# Patient Record
Sex: Female | Born: 1955 | Race: Black or African American | Hispanic: No | Marital: Single | Smoking: Never smoker
Health system: Southern US, Community
[De-identification: ages and names within clinical notes are randomized; demographics above are authoritative.]

## PROBLEM LIST (undated history)

## (undated) DIAGNOSIS — E119 Type 2 diabetes mellitus without complications: Secondary | ICD-10-CM

## (undated) DIAGNOSIS — K219 Gastro-esophageal reflux disease without esophagitis: Secondary | ICD-10-CM

## (undated) HISTORY — DX: Gastro-esophageal reflux disease without esophagitis: K21.9

## (undated) HISTORY — PX: OTHER SURGICAL HISTORY: SHX169

## (undated) HISTORY — PX: BREAST EXCISIONAL BIOPSY: SUR124

## (undated) HISTORY — DX: Type 2 diabetes mellitus without complications: E11.9

---

## 1998-04-10 ENCOUNTER — Ambulatory Visit (HOSPITAL_COMMUNITY): Admission: RE | Admit: 1998-04-10 | Discharge: 1998-04-10 | Payer: Self-pay | Admitting: Obstetrics and Gynecology

## 1998-04-14 ENCOUNTER — Other Ambulatory Visit: Admission: RE | Admit: 1998-04-14 | Discharge: 1998-04-14 | Payer: Self-pay | Admitting: Obstetrics and Gynecology

## 1999-04-19 ENCOUNTER — Other Ambulatory Visit: Admission: RE | Admit: 1999-04-19 | Discharge: 1999-04-19 | Payer: Self-pay | Admitting: Obstetrics and Gynecology

## 1999-10-11 ENCOUNTER — Other Ambulatory Visit: Admission: RE | Admit: 1999-10-11 | Discharge: 1999-10-11 | Payer: Self-pay | Admitting: Obstetrics and Gynecology

## 2000-04-28 ENCOUNTER — Encounter: Payer: Self-pay | Admitting: Obstetrics and Gynecology

## 2000-04-28 ENCOUNTER — Ambulatory Visit (HOSPITAL_COMMUNITY): Admission: RE | Admit: 2000-04-28 | Discharge: 2000-04-28 | Payer: Self-pay | Admitting: Obstetrics and Gynecology

## 2000-06-13 ENCOUNTER — Other Ambulatory Visit: Admission: RE | Admit: 2000-06-13 | Discharge: 2000-06-13 | Payer: Self-pay | Admitting: *Deleted

## 2001-04-16 ENCOUNTER — Other Ambulatory Visit: Admission: RE | Admit: 2001-04-16 | Discharge: 2001-04-16 | Payer: Self-pay | Admitting: Obstetrics and Gynecology

## 2002-05-09 ENCOUNTER — Other Ambulatory Visit: Admission: RE | Admit: 2002-05-09 | Discharge: 2002-05-09 | Payer: Self-pay | Admitting: Obstetrics and Gynecology

## 2002-08-16 ENCOUNTER — Ambulatory Visit (HOSPITAL_COMMUNITY): Admission: RE | Admit: 2002-08-16 | Discharge: 2002-08-16 | Payer: Self-pay | Admitting: Obstetrics and Gynecology

## 2002-08-16 ENCOUNTER — Encounter: Payer: Self-pay | Admitting: Obstetrics and Gynecology

## 2003-08-18 ENCOUNTER — Encounter: Payer: Self-pay | Admitting: Obstetrics and Gynecology

## 2003-08-18 ENCOUNTER — Ambulatory Visit (HOSPITAL_COMMUNITY): Admission: RE | Admit: 2003-08-18 | Discharge: 2003-08-18 | Payer: Self-pay | Admitting: Obstetrics and Gynecology

## 2004-08-19 ENCOUNTER — Ambulatory Visit (HOSPITAL_COMMUNITY): Admission: RE | Admit: 2004-08-19 | Discharge: 2004-08-19 | Payer: Self-pay | Admitting: Obstetrics and Gynecology

## 2005-06-30 ENCOUNTER — Ambulatory Visit: Payer: Self-pay | Admitting: Internal Medicine

## 2005-08-05 ENCOUNTER — Ambulatory Visit: Payer: Self-pay | Admitting: Internal Medicine

## 2005-08-12 ENCOUNTER — Ambulatory Visit: Payer: Self-pay | Admitting: Internal Medicine

## 2005-08-22 ENCOUNTER — Ambulatory Visit (HOSPITAL_COMMUNITY): Admission: RE | Admit: 2005-08-22 | Discharge: 2005-08-22 | Payer: Self-pay | Admitting: Obstetrics and Gynecology

## 2006-12-08 ENCOUNTER — Ambulatory Visit (HOSPITAL_COMMUNITY): Admission: RE | Admit: 2006-12-08 | Discharge: 2006-12-08 | Payer: Self-pay | Admitting: Family Medicine

## 2008-01-25 ENCOUNTER — Ambulatory Visit (HOSPITAL_COMMUNITY): Admission: RE | Admit: 2008-01-25 | Discharge: 2008-01-25 | Payer: Self-pay | Admitting: Family Medicine

## 2009-01-28 ENCOUNTER — Ambulatory Visit (HOSPITAL_COMMUNITY): Admission: RE | Admit: 2009-01-28 | Discharge: 2009-01-28 | Payer: Self-pay | Admitting: Internal Medicine

## 2009-03-04 ENCOUNTER — Encounter: Admission: RE | Admit: 2009-03-04 | Discharge: 2009-03-04 | Payer: Self-pay | Admitting: Internal Medicine

## 2010-03-04 ENCOUNTER — Ambulatory Visit (HOSPITAL_COMMUNITY): Admission: RE | Admit: 2010-03-04 | Discharge: 2010-03-04 | Payer: Self-pay | Admitting: Internal Medicine

## 2010-06-28 ENCOUNTER — Encounter: Payer: Self-pay | Admitting: Internal Medicine

## 2011-01-06 NOTE — Letter (Signed)
Summary: Colonoscopy Letter  [facility]  [address] [place], [place] [address]   Phone: [phone]  Fax: [phone]      June 28, 2010 MRN: [id number]   [facility] [address] [place], [place]  [address]   Dear Ms. Benoist,   According to your medical record, it is time for you to schedule a Colonoscopy. The American Cancer Society recommends this procedure as a method to detect early colon cancer. Patients with a family history of colon cancer, or a personal history of colon polyps or inflammatory bowel disease are at increased risk.  This letter has been generated based on the recommendations made at the time of your procedure. If you feel that in your particular situation this may no longer apply, please contact our office.  Please call our office at [phone] to schedule this appointment or to update your records at your earliest convenience.  Thank you for cooperating with [place] to provide you with the very best care possible.   Sincerely,  Wilhemina Bonito. Marina Goodell, M.D.  [facility] [phone]

## 2011-03-15 ENCOUNTER — Other Ambulatory Visit: Payer: Self-pay | Admitting: Internal Medicine

## 2011-03-15 DIAGNOSIS — Z1231 Encounter for screening mammogram for malignant neoplasm of breast: Secondary | ICD-10-CM

## 2011-04-01 ENCOUNTER — Ambulatory Visit (HOSPITAL_COMMUNITY)
Admission: RE | Admit: 2011-04-01 | Discharge: 2011-04-01 | Disposition: A | Discharge status: Home / Self Care | Payer: Self-pay | Source: Ambulatory Visit | Attending: Internal Medicine | Admitting: Internal Medicine

## 2011-04-01 DIAGNOSIS — Z1231 Encounter for screening mammogram for malignant neoplasm of breast: Secondary | ICD-10-CM

## 2012-03-14 ENCOUNTER — Other Ambulatory Visit: Payer: Self-pay | Admitting: Internal Medicine

## 2012-03-14 DIAGNOSIS — Z1231 Encounter for screening mammogram for malignant neoplasm of breast: Secondary | ICD-10-CM

## 2012-04-09 ENCOUNTER — Ambulatory Visit (HOSPITAL_COMMUNITY): Payer: Self-pay

## 2012-04-10 ENCOUNTER — Ambulatory Visit (INDEPENDENT_AMBULATORY_CARE_PROVIDER_SITE_OTHER): Payer: Self-pay | Admitting: *Deleted

## 2012-04-10 ENCOUNTER — Ambulatory Visit (HOSPITAL_COMMUNITY)
Admission: RE | Admit: 2012-04-10 | Discharge: 2012-04-10 | Disposition: A | Discharge status: Home / Self Care | Payer: Self-pay | Source: Ambulatory Visit | Attending: Internal Medicine | Admitting: Internal Medicine

## 2012-04-10 VITALS — BP 128/71 | HR 90 | Temp 97.6°F | Ht 62.0 in | Wt 141.1 lb

## 2012-04-10 DIAGNOSIS — Z1231 Encounter for screening mammogram for malignant neoplasm of breast: Secondary | ICD-10-CM

## 2012-04-10 DIAGNOSIS — N898 Other specified noninflammatory disorders of vagina: Secondary | ICD-10-CM

## 2012-04-10 DIAGNOSIS — Z01419 Encounter for gynecological examination (general) (routine) without abnormal findings: Secondary | ICD-10-CM

## 2012-04-10 NOTE — Progress Notes (Signed)
Addended by: Lynnell Dike on: 04/10/2012 09:29 AM   Modules accepted: Orders

## 2012-04-10 NOTE — Patient Instructions (Signed)
Taught patient how to perform BSE. Let her know [facility] will cover Pap smears every 3 years unless has a history of abnormal Pap smears. Patient is escorted to mammography for screening mammogram. Let patient know will follow up with her within the next couple weeks with results. Patient verbalized understanding.

## 2012-04-10 NOTE — Progress Notes (Signed)
No complaints today.  Pap Smear:  Completed Pap smear today. Per patient last Pap smear was around April 2010 and normal. Per patient no history of abnormal Pap smears. No Pap smear results in EPIC.  Physical exam: Breasts Breasts symmetrical. Multiple keloids on bilateral breasts. No nipple retraction bilateral breasts. No nipple discharge bilateral breasts. No lymphadenopathy. No lumps palpated bilateral breasts. No complaints of pain or tenderness on exam.          Pelvic/Bimanual   Ext Genitalia No lesions, no swelling and no discharge observed on external genitalia.         Vagina Vagina pink and normal texture. No lesions or thick yellowish colored discharge observed in vagina. Wet prep completed.          Cervix Cervix is present. Cervix pink and of normal texture. Cervix friable. Thick yellowish colored discharge observed on cervix.          Uterus Uterus is present and palpable. Uterus in normal position and normal size.       Adnexae Bilateral ovaries present and palpable. No tenderness on palpation.        Rectovaginal No rectal exam completed today since patient had no rectal complaints. No skin abnormalities observed on exam.

## 2012-04-11 LAB — WET PREP, GENITAL: Trich, Wet Prep: NONE SEEN

## 2012-04-17 ENCOUNTER — Other Ambulatory Visit: Payer: Self-pay | Admitting: *Deleted

## 2012-04-17 ENCOUNTER — Telehealth: Payer: Self-pay | Admitting: *Deleted

## 2012-04-17 DIAGNOSIS — B379 Candidiasis, unspecified: Secondary | ICD-10-CM

## 2012-04-17 MED ORDER — FLUCONAZOLE 150 MG PO TABS: 150.0000 mg | ORAL_TABLET | Freq: Once | ORAL | Status: AC

## 2012-04-17 NOTE — Telephone Encounter (Signed)
Telephoned patient and left message to return call to [employer]. Patient has rx for Diflucan at pharmacy.

## 2012-04-18 ENCOUNTER — Telehealth: Payer: Self-pay | Admitting: *Deleted

## 2012-04-18 NOTE — Telephone Encounter (Signed)
Patient returned phone call. Gave her results to Pap smear letting her know it was normal and showed yeast. Let patient know a prescription for Diflucan has been sent to her pharmacy for her to pick up. Let patient know that her mammogram was normal and that she will need a screening mammogram in 1 year. Patient verbalized understanding.

## 2012-05-10 ENCOUNTER — Encounter: Payer: Self-pay | Admitting: Family Medicine

## 2012-05-10 ENCOUNTER — Ambulatory Visit (INDEPENDENT_AMBULATORY_CARE_PROVIDER_SITE_OTHER): Payer: Self-pay | Admitting: Family Medicine

## 2012-05-10 VITALS — BP 130/74 | HR 85 | Temp 98.1°F | Ht 62.0 in | Wt 143.0 lb

## 2012-05-10 DIAGNOSIS — Z01419 Encounter for gynecological examination (general) (routine) without abnormal findings: Secondary | ICD-10-CM | POA: Insufficient documentation

## 2012-05-10 DIAGNOSIS — R12 Heartburn: Secondary | ICD-10-CM | POA: Insufficient documentation

## 2012-05-10 DIAGNOSIS — Z0000 Encounter for general adult medical examination without abnormal findings: Secondary | ICD-10-CM

## 2012-05-10 NOTE — Progress Notes (Signed)
  Subjective:    Patient ID: Lasandra Beech, female    DOB: December 01, 1956, 56 y.o.   MRN: [id number]  HPI No concerns today Here to est care Has lost about 20 lbs intentionally over the last 6 months Watching diet Goes to gym 2-3x/week.  Treadmill and weights Review of Systems No change in BM, Denies CP, SOB, HA, N/V/D, fever     Objective:   Physical Exam Vital signs reviewed General appearance - alert, well appearing, and in no distress Heart - normal rate, regular rhythm, normal S1, S2, no murmurs, rubs, clicks or gallops Chest - clear to auscultation, no wheezes, rales or rhonchi, symmetric air entry, no tachypnea, retractions or cyanosis Abdomen - soft, nontender, nondistended, no masses or organomegaly Extremities - peripheral pulses normal, no pedal edema, no clubbing or cyanosis        Assessment & Plan:

## 2012-05-10 NOTE — Assessment & Plan Note (Signed)
Here to est care.  Had gyn exam at [facility] GYN office.  Will return for lipid, CMET, and tetanus shot.

## 2012-05-10 NOTE — Patient Instructions (Signed)
Please go over to [facility] Once you have that card, call for a lab appt for your cholesterol check Make sure you do not eat or drink except water 8 hours before Please come back for a nurse visit for your tetanus shot

## 2013-04-25 ENCOUNTER — Other Ambulatory Visit (HOSPITAL_COMMUNITY): Payer: Self-pay | Admitting: Internal Medicine

## 2013-04-25 DIAGNOSIS — Z1231 Encounter for screening mammogram for malignant neoplasm of breast: Secondary | ICD-10-CM

## 2013-04-26 ENCOUNTER — Ambulatory Visit (HOSPITAL_COMMUNITY)
Admission: RE | Admit: 2013-04-26 | Discharge: 2013-04-26 | Disposition: A | Discharge status: Home / Self Care | Payer: BC Managed Care – PPO | Source: Ambulatory Visit | Attending: Internal Medicine | Admitting: Internal Medicine

## 2013-04-26 DIAGNOSIS — Z1231 Encounter for screening mammogram for malignant neoplasm of breast: Secondary | ICD-10-CM

## 2013-11-19 ENCOUNTER — Encounter: Payer: Self-pay | Admitting: Internal Medicine

## 2014-01-09 ENCOUNTER — Ambulatory Visit (AMBULATORY_SURGERY_CENTER): Payer: Self-pay | Admitting: *Deleted

## 2014-01-09 VITALS — Ht 62.0 in | Wt 137.0 lb

## 2014-01-09 DIAGNOSIS — Z86010 Personal history of colonic polyps: Secondary | ICD-10-CM

## 2014-01-09 MED ORDER — MOVIPREP 100 G PO SOLR: ORAL | Status: DC

## 2014-01-09 NOTE — Progress Notes (Signed)
No allergies to eggs or soy. No problems with anesthesia.  

## 2014-01-10 ENCOUNTER — Encounter: Payer: Self-pay | Admitting: Internal Medicine

## 2014-01-15 ENCOUNTER — Encounter: Payer: Self-pay | Admitting: Internal Medicine

## 2014-01-23 ENCOUNTER — Encounter: Payer: Self-pay | Admitting: Internal Medicine

## 2014-01-23 ENCOUNTER — Ambulatory Visit (AMBULATORY_SURGERY_CENTER): Payer: 59 | Admitting: Internal Medicine

## 2014-01-23 VITALS — BP 149/87 | HR 63 | Temp 98.9°F | Resp 11 | Ht 62.0 in | Wt 137.0 lb

## 2014-01-23 DIAGNOSIS — Z86010 Personal history of colonic polyps: Secondary | ICD-10-CM

## 2014-01-23 DIAGNOSIS — Z1211 Encounter for screening for malignant neoplasm of colon: Secondary | ICD-10-CM

## 2014-01-23 MED ORDER — SODIUM CHLORIDE 0.9 % IV SOLN: 500.0000 mL | INTRAVENOUS | Status: DC

## 2014-01-23 NOTE — Patient Instructions (Signed)

## 2014-01-23 NOTE — Progress Notes (Signed)
NO EGG OR SOY ALLERGY EWM NO PROBLEMS WITH PAST SEDATION. EWM 

## 2014-01-23 NOTE — Progress Notes (Signed)
Report to pacu RN, vss, bbs=clear 

## 2014-01-23 NOTE — Op Note (Signed)
[facility] [address] [place], [id number]   COLONOSCOPY PROCEDURE REPORT  PATIENT: Amanda Valencia, Amanda J.  MR#: [id number] BIRTHDATE: [birth year] , 57  yrs. old GENDER: Female ENDOSCOPIST: Roxy CedarJohn N Broghan Pannone Jr, MD REFERRED XB:JYNWGNBY:Ronald Polite, M.D. PROCEDURE DATE:  01/23/2014 PROCEDURE:   Colonoscopy, screening First Screening Colonoscopy - Avg.  risk and is 50 yrs.  old or older - No.  Prior Negative Screening - Now for repeat screening. N/A  History of Adenoma - Now for follow-up colonoscopy & has been > or = to 3 yrs.  N/A  Polyps Removed Today? No.  Recommend repeat exam, <10 yrs? No. ASA CLASS:   Class II INDICATIONS:Patient's personal history of colon polyps.   index exam September 2006 with diminutive polyp (no path) MEDICATIONS: MAC sedation, administered by CRNA and propofol (Diprivan) 250mg  IV  DESCRIPTION OF PROCEDURE:   After the risks benefits and alternatives of the procedure were thoroughly explained, informed consent was obtained.  A digital rectal exam revealed no abnormalities of the rectum.   The LB [id number]-[id number]  endoscope was introduced through the anus and advanced to the cecum, which was identified by both the appendix and ileocecal valve. No adverse events experienced.   The quality of the prep was excellent, using MoviPrep  The instrument was then slowly withdrawn as the colon was fully examined.   COLON FINDINGS: Mild diverticulosis was noted in the sigmoid colon. The colon was otherwise normal.  There was no  inflammation, polyps or cancers .  Retroflexed views revealed no abnormalities. The time to cecum=4 minutes 07 seconds.  Withdrawal time=11 minutes 50 seconds.  The scope was withdrawn and the procedure completed.  COMPLICATIONS: There were no complications.  ENDOSCOPIC IMPRESSION: 1.   Mild diverticulosis was noted in the sigmoid colon 2.   The colon was otherwise normal  RECOMMENDATIONS: 1. Continue current colorectal screening  recommendations for "routine risk" patients with a repeat colonoscopy in 10 years.   eSigned:  Roxy CedarJohn N Onyekachi Gathright Jr, MD 01/23/2014 11:34 AM   cc: Renford DillsPolite, Ronald MD and The Patient

## 2014-01-24 ENCOUNTER — Telehealth: Payer: Self-pay | Admitting: *Deleted

## 2014-01-24 NOTE — Telephone Encounter (Signed)
  Follow up Call-  Call back number 01/23/2014  Post procedure Call Back phone  # [phone]  Permission to leave phone message Yes     Patient questions:  Do you have a fever, pain , or abdominal swelling? no Pain Score  0 *  Have you tolerated food without any problems? yes  Have you been able to return to your normal activities? yes  Do you have any questions about your discharge instructions: Diet   no Medications  no Follow up visit  no  Do you have questions or concerns about your Care? no  Actions: * If pain score is 4 or above: No action needed, pain <4.

## 2014-05-26 ENCOUNTER — Other Ambulatory Visit (HOSPITAL_COMMUNITY): Payer: Self-pay | Admitting: Internal Medicine

## 2014-05-26 DIAGNOSIS — Z1231 Encounter for screening mammogram for malignant neoplasm of breast: Secondary | ICD-10-CM

## 2014-05-29 ENCOUNTER — Ambulatory Visit (HOSPITAL_COMMUNITY)
Admission: RE | Admit: 2014-05-29 | Discharge: 2014-05-29 | Disposition: A | Discharge status: Home / Self Care | Payer: 59 | Source: Ambulatory Visit | Attending: Internal Medicine | Admitting: Internal Medicine

## 2014-05-29 DIAGNOSIS — Z1231 Encounter for screening mammogram for malignant neoplasm of breast: Secondary | ICD-10-CM | POA: Insufficient documentation

## 2014-09-05 ENCOUNTER — Encounter: Payer: Self-pay | Admitting: Internal Medicine

## 2014-10-06 ENCOUNTER — Encounter: Payer: Self-pay | Admitting: Internal Medicine

## 2015-04-29 ENCOUNTER — Other Ambulatory Visit (HOSPITAL_COMMUNITY): Payer: Self-pay | Admitting: Internal Medicine

## 2015-04-29 DIAGNOSIS — Z1231 Encounter for screening mammogram for malignant neoplasm of breast: Secondary | ICD-10-CM

## 2015-06-02 ENCOUNTER — Ambulatory Visit (HOSPITAL_COMMUNITY)
Admission: RE | Admit: 2015-06-02 | Discharge: 2015-06-02 | Disposition: A | Discharge status: Home / Self Care | Payer: 59 | Source: Ambulatory Visit | Attending: Internal Medicine | Admitting: Internal Medicine

## 2015-06-02 DIAGNOSIS — Z1231 Encounter for screening mammogram for malignant neoplasm of breast: Secondary | ICD-10-CM | POA: Diagnosis present

## 2016-04-28 ENCOUNTER — Other Ambulatory Visit: Payer: Self-pay | Admitting: Nurse Practitioner

## 2016-04-28 ENCOUNTER — Other Ambulatory Visit: Payer: Self-pay

## 2016-04-28 DIAGNOSIS — M25511 Pain in right shoulder: Secondary | ICD-10-CM

## 2016-04-28 DIAGNOSIS — Z1231 Encounter for screening mammogram for malignant neoplasm of breast: Secondary | ICD-10-CM

## 2016-05-01 ENCOUNTER — Ambulatory Visit
Admission: RE | Admit: 2016-05-01 | Discharge: 2016-05-01 | Disposition: A | Discharge status: Home / Self Care | Payer: BLUE CROSS/BLUE SHIELD | Source: Ambulatory Visit | Attending: Nurse Practitioner | Admitting: Nurse Practitioner

## 2016-05-01 DIAGNOSIS — M25511 Pain in right shoulder: Secondary | ICD-10-CM

## 2016-06-02 ENCOUNTER — Ambulatory Visit
Admission: RE | Admit: 2016-06-02 | Discharge: 2016-06-02 | Disposition: A | Discharge status: Home / Self Care | Payer: BLUE CROSS/BLUE SHIELD | Source: Ambulatory Visit

## 2016-06-02 DIAGNOSIS — Z1231 Encounter for screening mammogram for malignant neoplasm of breast: Secondary | ICD-10-CM

## 2016-06-03 ENCOUNTER — Ambulatory Visit: Payer: Self-pay

## 2017-05-02 ENCOUNTER — Other Ambulatory Visit: Payer: Self-pay | Admitting: Internal Medicine

## 2017-05-02 DIAGNOSIS — Z1231 Encounter for screening mammogram for malignant neoplasm of breast: Secondary | ICD-10-CM

## 2017-06-05 ENCOUNTER — Ambulatory Visit
Admission: RE | Admit: 2017-06-05 | Discharge: 2017-06-05 | Disposition: A | Discharge status: Home / Self Care | Payer: BLUE CROSS/BLUE SHIELD | Source: Ambulatory Visit | Attending: Internal Medicine | Admitting: Internal Medicine

## 2017-06-05 DIAGNOSIS — Z1231 Encounter for screening mammogram for malignant neoplasm of breast: Secondary | ICD-10-CM

## 2017-10-30 ENCOUNTER — Encounter (HOSPITAL_COMMUNITY): Payer: Self-pay

## 2018-05-14 ENCOUNTER — Other Ambulatory Visit: Payer: Self-pay | Admitting: Internal Medicine

## 2018-05-14 ENCOUNTER — Other Ambulatory Visit (HOSPITAL_COMMUNITY)
Admission: RE | Admit: 2018-05-14 | Discharge: 2018-05-14 | Disposition: A | Discharge status: Home / Self Care | Payer: BLUE CROSS/BLUE SHIELD | Source: Ambulatory Visit | Attending: Internal Medicine | Admitting: Internal Medicine

## 2018-05-14 DIAGNOSIS — Z124 Encounter for screening for malignant neoplasm of cervix: Secondary | ICD-10-CM | POA: Diagnosis present

## 2018-05-17 LAB — CYTOLOGY - PAP
Diagnosis: NEGATIVE
HPV (WINDOPATH): NOT DETECTED

## 2018-05-31 ENCOUNTER — Other Ambulatory Visit: Payer: Self-pay | Admitting: Internal Medicine

## 2018-05-31 DIAGNOSIS — Z1231 Encounter for screening mammogram for malignant neoplasm of breast: Secondary | ICD-10-CM

## 2018-06-21 ENCOUNTER — Ambulatory Visit
Admission: RE | Admit: 2018-06-21 | Discharge: 2018-06-21 | Disposition: A | Discharge status: Home / Self Care | Payer: BLUE CROSS/BLUE SHIELD | Source: Ambulatory Visit | Attending: Internal Medicine | Admitting: Internal Medicine

## 2018-06-21 DIAGNOSIS — Z1231 Encounter for screening mammogram for malignant neoplasm of breast: Secondary | ICD-10-CM

## 2018-06-22 ENCOUNTER — Ambulatory Visit: Payer: BLUE CROSS/BLUE SHIELD

## 2018-11-08 ENCOUNTER — Ambulatory Visit
Admission: RE | Admit: 2018-11-08 | Discharge: 2018-11-08 | Disposition: A | Discharge status: Home / Self Care | Payer: BLUE CROSS/BLUE SHIELD | Source: Ambulatory Visit | Attending: Internal Medicine | Admitting: Internal Medicine

## 2018-11-08 ENCOUNTER — Other Ambulatory Visit: Payer: Self-pay | Admitting: Internal Medicine

## 2018-11-08 DIAGNOSIS — R140 Abdominal distension (gaseous): Secondary | ICD-10-CM

## 2019-05-16 ENCOUNTER — Other Ambulatory Visit: Payer: Self-pay | Admitting: Internal Medicine

## 2019-05-16 DIAGNOSIS — Z1231 Encounter for screening mammogram for malignant neoplasm of breast: Secondary | ICD-10-CM

## 2019-06-28 ENCOUNTER — Ambulatory Visit: Payer: BLUE CROSS/BLUE SHIELD

## 2020-06-11 ENCOUNTER — Other Ambulatory Visit: Payer: Self-pay | Admitting: Internal Medicine

## 2020-06-11 DIAGNOSIS — Z1231 Encounter for screening mammogram for malignant neoplasm of breast: Secondary | ICD-10-CM

## 2020-07-10 ENCOUNTER — Other Ambulatory Visit: Payer: Self-pay

## 2020-07-10 ENCOUNTER — Ambulatory Visit
Admission: RE | Admit: 2020-07-10 | Discharge: 2020-07-10 | Disposition: A | Discharge status: Home / Self Care | Payer: Medicare Other | Source: Ambulatory Visit | Attending: Internal Medicine | Admitting: Internal Medicine

## 2020-07-10 DIAGNOSIS — Z1231 Encounter for screening mammogram for malignant neoplasm of breast: Secondary | ICD-10-CM

## 2021-02-15 DIAGNOSIS — E1169 Type 2 diabetes mellitus with other specified complication: Secondary | ICD-10-CM | POA: Diagnosis not present

## 2021-05-31 ENCOUNTER — Other Ambulatory Visit: Payer: Self-pay | Admitting: Internal Medicine

## 2021-05-31 DIAGNOSIS — Z1231 Encounter for screening mammogram for malignant neoplasm of breast: Secondary | ICD-10-CM

## 2021-06-18 DIAGNOSIS — U071 COVID-19: Secondary | ICD-10-CM | POA: Diagnosis not present

## 2021-06-18 DIAGNOSIS — Z20822 Contact with and (suspected) exposure to covid-19: Secondary | ICD-10-CM | POA: Diagnosis not present

## 2021-07-21 ENCOUNTER — Ambulatory Visit
Admission: RE | Admit: 2021-07-21 | Discharge: 2021-07-21 | Disposition: A | Discharge status: Home / Self Care | Payer: Medicare Other | Source: Ambulatory Visit | Attending: Physician Assistant | Admitting: Physician Assistant

## 2021-07-21 ENCOUNTER — Other Ambulatory Visit: Payer: Self-pay | Admitting: Physician Assistant

## 2021-07-21 DIAGNOSIS — R058 Other specified cough: Secondary | ICD-10-CM | POA: Diagnosis not present

## 2021-07-21 DIAGNOSIS — R059 Cough, unspecified: Secondary | ICD-10-CM | POA: Diagnosis not present

## 2021-07-22 ENCOUNTER — Other Ambulatory Visit: Payer: Self-pay

## 2021-07-22 ENCOUNTER — Ambulatory Visit
Admission: RE | Admit: 2021-07-22 | Discharge: 2021-07-22 | Disposition: A | Discharge status: Home / Self Care | Payer: Medicare Other | Source: Ambulatory Visit | Attending: Internal Medicine | Admitting: Internal Medicine

## 2021-07-22 DIAGNOSIS — Z1231 Encounter for screening mammogram for malignant neoplasm of breast: Secondary | ICD-10-CM | POA: Diagnosis not present

## 2021-07-26 ENCOUNTER — Other Ambulatory Visit: Payer: Self-pay | Admitting: Internal Medicine

## 2021-07-26 DIAGNOSIS — R928 Other abnormal and inconclusive findings on diagnostic imaging of breast: Secondary | ICD-10-CM

## 2021-08-10 ENCOUNTER — Ambulatory Visit
Admission: RE | Admit: 2021-08-10 | Discharge: 2021-08-10 | Disposition: A | Discharge status: Home / Self Care | Payer: Medicare Other | Source: Ambulatory Visit | Attending: Internal Medicine | Admitting: Internal Medicine

## 2021-08-10 ENCOUNTER — Other Ambulatory Visit: Payer: Self-pay | Admitting: Internal Medicine

## 2021-08-10 ENCOUNTER — Other Ambulatory Visit: Payer: Self-pay

## 2021-08-10 DIAGNOSIS — R928 Other abnormal and inconclusive findings on diagnostic imaging of breast: Secondary | ICD-10-CM

## 2021-08-10 DIAGNOSIS — R922 Inconclusive mammogram: Secondary | ICD-10-CM | POA: Diagnosis not present

## 2021-11-10 DIAGNOSIS — Z79899 Other long term (current) drug therapy: Secondary | ICD-10-CM | POA: Diagnosis not present

## 2021-11-10 DIAGNOSIS — E1165 Type 2 diabetes mellitus with hyperglycemia: Secondary | ICD-10-CM | POA: Diagnosis not present

## 2021-11-10 DIAGNOSIS — K760 Fatty (change of) liver, not elsewhere classified: Secondary | ICD-10-CM | POA: Diagnosis not present

## 2021-11-10 DIAGNOSIS — Z0000 Encounter for general adult medical examination without abnormal findings: Secondary | ICD-10-CM | POA: Diagnosis not present

## 2021-11-10 DIAGNOSIS — Z5181 Encounter for therapeutic drug level monitoring: Secondary | ICD-10-CM | POA: Diagnosis not present

## 2021-11-10 DIAGNOSIS — Z7984 Long term (current) use of oral hypoglycemic drugs: Secondary | ICD-10-CM | POA: Diagnosis not present

## 2021-11-10 DIAGNOSIS — Z23 Encounter for immunization: Secondary | ICD-10-CM | POA: Diagnosis not present

## 2022-02-09 ENCOUNTER — Other Ambulatory Visit: Payer: Medicare Other

## 2022-03-03 ENCOUNTER — Other Ambulatory Visit: Payer: Self-pay | Admitting: Internal Medicine

## 2022-03-03 ENCOUNTER — Ambulatory Visit
Admission: RE | Admit: 2022-03-03 | Discharge: 2022-03-03 | Disposition: A | Discharge status: Home / Self Care | Payer: Medicare Other | Source: Ambulatory Visit | Attending: Internal Medicine | Admitting: Internal Medicine

## 2022-03-03 DIAGNOSIS — N6310 Unspecified lump in the right breast, unspecified quadrant: Secondary | ICD-10-CM

## 2022-03-03 DIAGNOSIS — N6313 Unspecified lump in the right breast, lower outer quadrant: Secondary | ICD-10-CM | POA: Diagnosis not present

## 2022-03-03 DIAGNOSIS — R928 Other abnormal and inconclusive findings on diagnostic imaging of breast: Secondary | ICD-10-CM

## 2022-05-18 DIAGNOSIS — K760 Fatty (change of) liver, not elsewhere classified: Secondary | ICD-10-CM | POA: Diagnosis not present

## 2022-05-18 DIAGNOSIS — E7800 Pure hypercholesterolemia, unspecified: Secondary | ICD-10-CM | POA: Diagnosis not present

## 2022-05-18 DIAGNOSIS — E1165 Type 2 diabetes mellitus with hyperglycemia: Secondary | ICD-10-CM | POA: Diagnosis not present

## 2022-09-06 ENCOUNTER — Other Ambulatory Visit: Payer: Self-pay | Admitting: Internal Medicine

## 2022-09-06 ENCOUNTER — Ambulatory Visit
Admission: RE | Admit: 2022-09-06 | Discharge: 2022-09-06 | Disposition: A | Discharge status: Home / Self Care | Payer: Medicare Other | Source: Ambulatory Visit | Attending: Internal Medicine | Admitting: Internal Medicine

## 2022-09-06 DIAGNOSIS — R921 Mammographic calcification found on diagnostic imaging of breast: Secondary | ICD-10-CM | POA: Diagnosis not present

## 2022-09-06 DIAGNOSIS — N6002 Solitary cyst of left breast: Secondary | ICD-10-CM | POA: Diagnosis not present

## 2022-09-06 DIAGNOSIS — N6313 Unspecified lump in the right breast, lower outer quadrant: Secondary | ICD-10-CM | POA: Diagnosis not present

## 2022-09-06 DIAGNOSIS — N6320 Unspecified lump in the left breast, unspecified quadrant: Secondary | ICD-10-CM

## 2022-09-06 DIAGNOSIS — N6310 Unspecified lump in the right breast, unspecified quadrant: Secondary | ICD-10-CM

## 2022-09-15 ENCOUNTER — Ambulatory Visit
Admission: RE | Admit: 2022-09-15 | Discharge: 2022-09-15 | Disposition: A | Discharge status: Home / Self Care | Payer: Medicare Other | Source: Ambulatory Visit | Attending: Internal Medicine | Admitting: Internal Medicine

## 2022-09-15 DIAGNOSIS — N6489 Other specified disorders of breast: Secondary | ICD-10-CM | POA: Diagnosis not present

## 2022-09-15 DIAGNOSIS — R921 Mammographic calcification found on diagnostic imaging of breast: Secondary | ICD-10-CM

## 2022-09-15 HISTORY — PX: BREAST BIOPSY: SHX20

## 2022-10-26 DIAGNOSIS — E119 Type 2 diabetes mellitus without complications: Secondary | ICD-10-CM | POA: Diagnosis not present

## 2022-11-17 DIAGNOSIS — K529 Noninfective gastroenteritis and colitis, unspecified: Secondary | ICD-10-CM | POA: Diagnosis not present

## 2022-11-17 DIAGNOSIS — K760 Fatty (change of) liver, not elsewhere classified: Secondary | ICD-10-CM | POA: Diagnosis not present

## 2022-11-17 DIAGNOSIS — E1169 Type 2 diabetes mellitus with other specified complication: Secondary | ICD-10-CM | POA: Diagnosis not present

## 2022-11-17 DIAGNOSIS — E7800 Pure hypercholesterolemia, unspecified: Secondary | ICD-10-CM | POA: Diagnosis not present

## 2022-11-17 DIAGNOSIS — R109 Unspecified abdominal pain: Secondary | ICD-10-CM | POA: Diagnosis not present

## 2023-01-20 DIAGNOSIS — E119 Type 2 diabetes mellitus without complications: Secondary | ICD-10-CM | POA: Diagnosis not present

## 2023-01-20 DIAGNOSIS — Z03818 Encounter for observation for suspected exposure to other biological agents ruled out: Secondary | ICD-10-CM | POA: Diagnosis not present

## 2023-01-20 DIAGNOSIS — J069 Acute upper respiratory infection, unspecified: Secondary | ICD-10-CM | POA: Diagnosis not present

## 2023-02-02 IMAGING — MG DIGITAL SCREENING BILAT W/ CAD
4 series · 4 of 4 positions shown · non-contrast
Comparison: Previous exams.

CLINICAL DATA: Screening.

EXAM:
DIGITAL SCREENING BILATERAL MAMMOGRAM WITH CAD
TECHNIQUE: Bilateral screening digital craniocaudal and mediolateral oblique
mammograms were obtained. The images were evaluated with
computer-aided detection.

[R MLO]
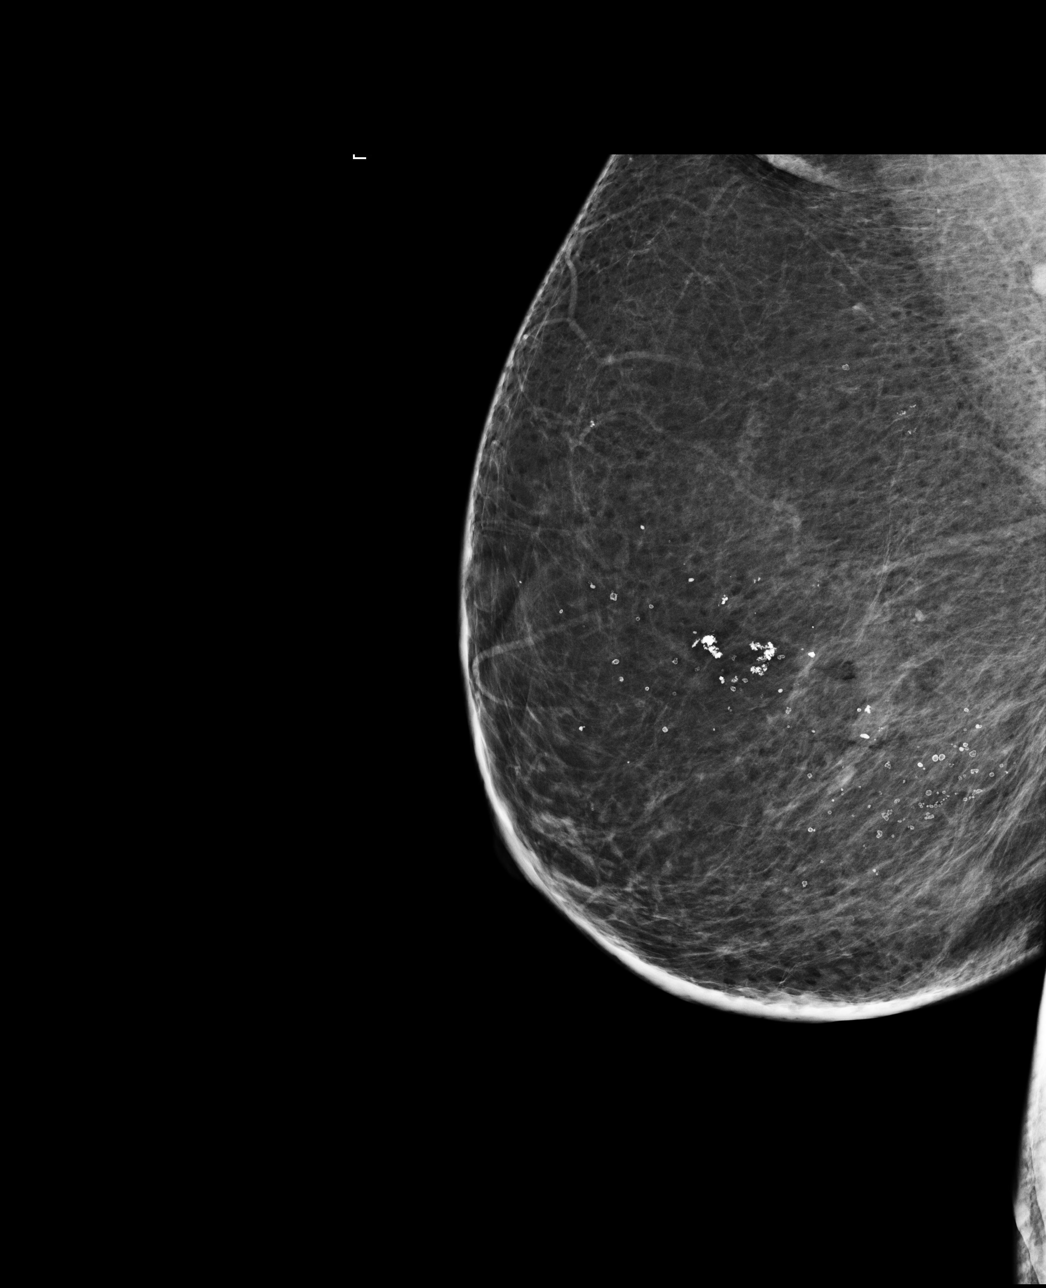

[L MLO]
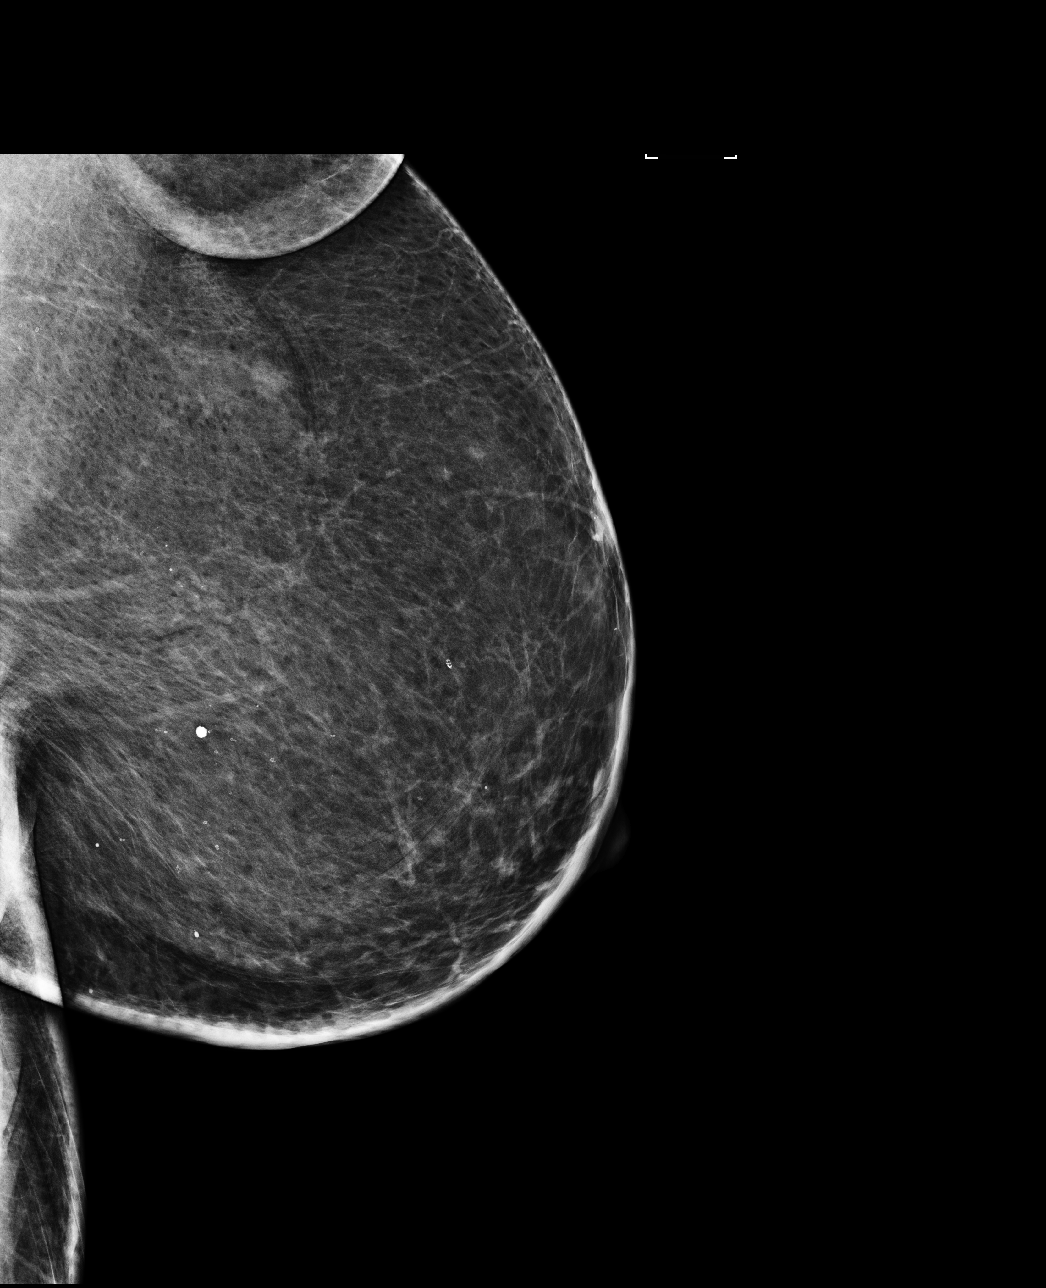

[R CC]
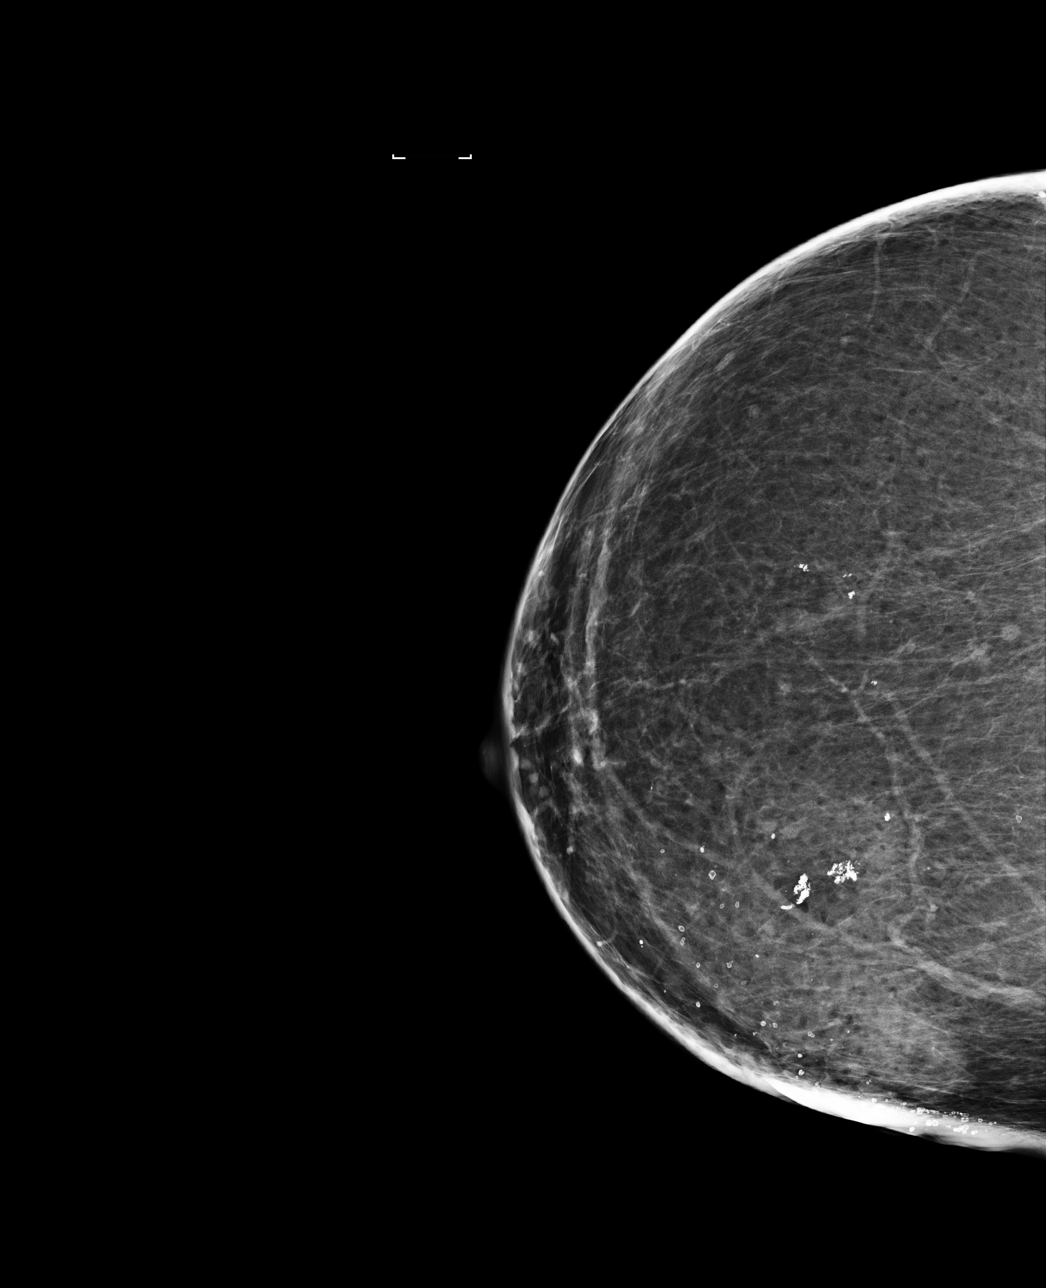

[L CC]
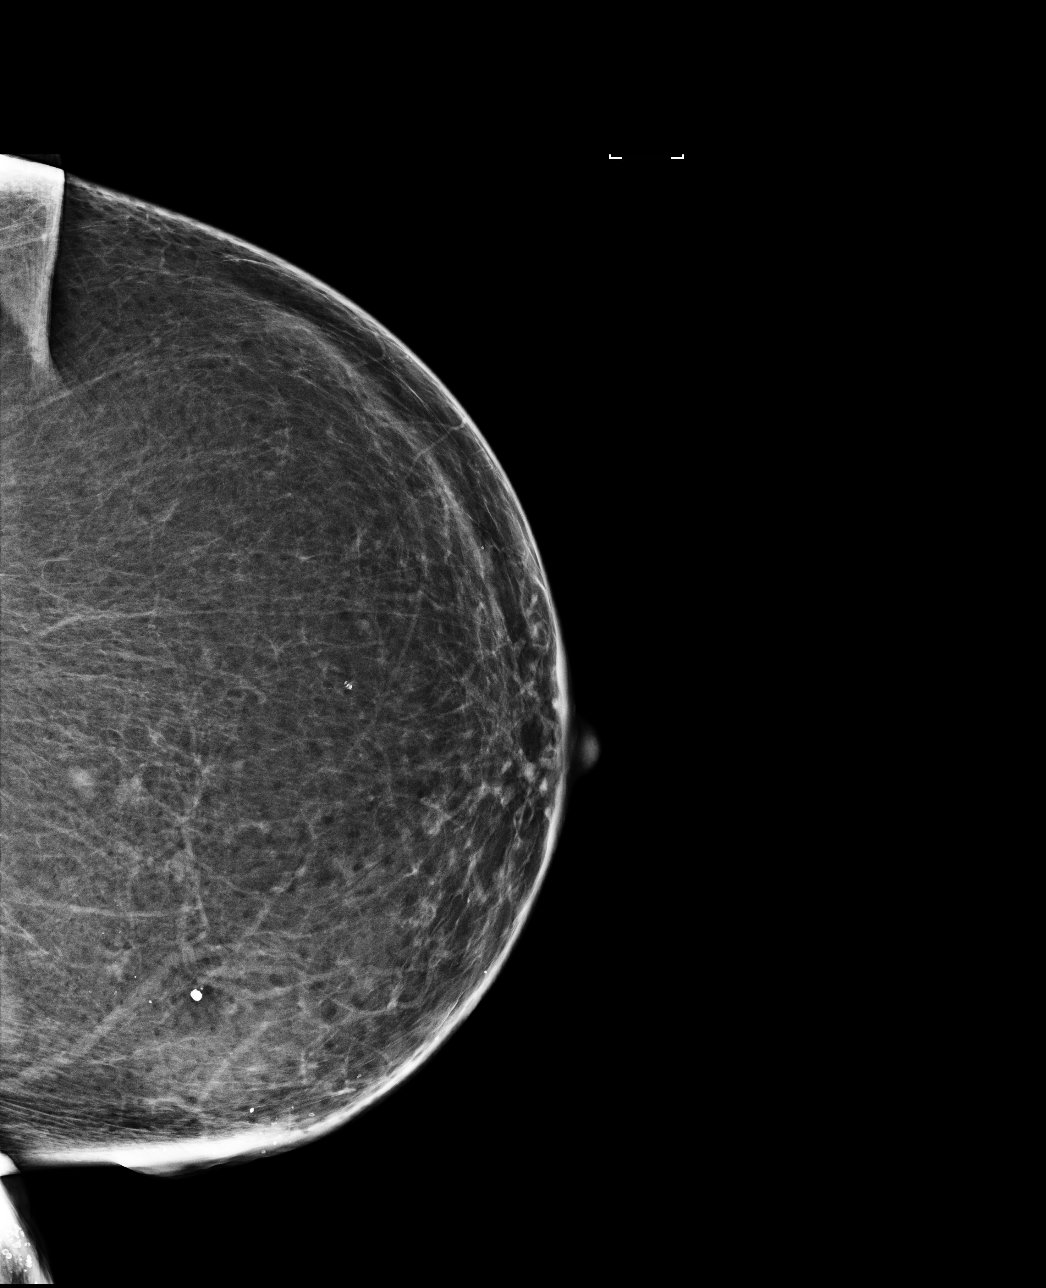

[4 of 4 positions shown; findings below may reference images not displayed]

ACR Breast Density Category b: There are scattered areas of
fibroglandular density.
FINDINGS: In the right breast, a possible mass warrants further evaluation. In
the left breast, no findings suspicious for malignancy.
IMPRESSION: Further evaluation is suggested for a possible mass in the right
breast.

RECOMMENDATION:
Diagnostic mammogram and possibly ultrasound of the right breast.
(Code:[id number])

The patient will be contacted regarding the findings, and additional
imaging will be scheduled.

BI-RADS CATEGORY  0: Incomplete. Need additional imaging evaluation
and/or prior mammograms for comparison.

## 2023-02-28 DIAGNOSIS — J302 Other seasonal allergic rhinitis: Secondary | ICD-10-CM | POA: Diagnosis not present

## 2023-02-28 DIAGNOSIS — Z5181 Encounter for therapeutic drug level monitoring: Secondary | ICD-10-CM | POA: Diagnosis not present

## 2023-02-28 DIAGNOSIS — Z0000 Encounter for general adult medical examination without abnormal findings: Secondary | ICD-10-CM | POA: Diagnosis not present

## 2023-02-28 DIAGNOSIS — Z23 Encounter for immunization: Secondary | ICD-10-CM | POA: Diagnosis not present

## 2023-02-28 DIAGNOSIS — E1165 Type 2 diabetes mellitus with hyperglycemia: Secondary | ICD-10-CM | POA: Diagnosis not present

## 2023-02-28 DIAGNOSIS — Z79899 Other long term (current) drug therapy: Secondary | ICD-10-CM | POA: Diagnosis not present

## 2023-02-28 DIAGNOSIS — E1136 Type 2 diabetes mellitus with diabetic cataract: Secondary | ICD-10-CM | POA: Diagnosis not present

## 2023-02-28 DIAGNOSIS — K760 Fatty (change of) liver, not elsewhere classified: Secondary | ICD-10-CM | POA: Diagnosis not present

## 2023-07-31 ENCOUNTER — Other Ambulatory Visit: Payer: Self-pay | Admitting: Internal Medicine

## 2023-07-31 DIAGNOSIS — N63 Unspecified lump in unspecified breast: Secondary | ICD-10-CM

## 2023-08-10 DIAGNOSIS — Z1152 Encounter for screening for COVID-19: Secondary | ICD-10-CM | POA: Diagnosis not present

## 2023-08-10 DIAGNOSIS — R051 Acute cough: Secondary | ICD-10-CM | POA: Diagnosis not present

## 2023-08-10 DIAGNOSIS — B349 Viral infection, unspecified: Secondary | ICD-10-CM | POA: Diagnosis not present

## 2023-08-24 DIAGNOSIS — J302 Other seasonal allergic rhinitis: Secondary | ICD-10-CM | POA: Diagnosis not present

## 2023-08-24 DIAGNOSIS — E7800 Pure hypercholesterolemia, unspecified: Secondary | ICD-10-CM | POA: Diagnosis not present

## 2023-08-24 DIAGNOSIS — K760 Fatty (change of) liver, not elsewhere classified: Secondary | ICD-10-CM | POA: Diagnosis not present

## 2023-08-24 DIAGNOSIS — E1136 Type 2 diabetes mellitus with diabetic cataract: Secondary | ICD-10-CM | POA: Diagnosis not present

## 2023-09-12 ENCOUNTER — Other Ambulatory Visit: Payer: Self-pay | Admitting: Internal Medicine

## 2023-09-12 ENCOUNTER — Ambulatory Visit
Admission: RE | Admit: 2023-09-12 | Discharge: 2023-09-12 | Disposition: A | Discharge status: Home / Self Care | Payer: 59 | Source: Ambulatory Visit | Attending: Internal Medicine | Admitting: Internal Medicine

## 2023-09-12 DIAGNOSIS — N63 Unspecified lump in unspecified breast: Secondary | ICD-10-CM

## 2023-09-12 DIAGNOSIS — N6313 Unspecified lump in the right breast, lower outer quadrant: Secondary | ICD-10-CM | POA: Diagnosis not present

## 2023-10-12 DIAGNOSIS — E1165 Type 2 diabetes mellitus with hyperglycemia: Secondary | ICD-10-CM | POA: Diagnosis not present

## 2023-10-31 DIAGNOSIS — E119 Type 2 diabetes mellitus without complications: Secondary | ICD-10-CM | POA: Diagnosis not present

## 2024-01-10 DIAGNOSIS — K219 Gastro-esophageal reflux disease without esophagitis: Secondary | ICD-10-CM | POA: Diagnosis not present

## 2024-01-10 DIAGNOSIS — R09A2 Foreign body sensation, throat: Secondary | ICD-10-CM | POA: Diagnosis not present

## 2024-02-01 DIAGNOSIS — Z1211 Encounter for screening for malignant neoplasm of colon: Secondary | ICD-10-CM | POA: Diagnosis not present

## 2024-02-01 DIAGNOSIS — R1314 Dysphagia, pharyngoesophageal phase: Secondary | ICD-10-CM | POA: Diagnosis not present

## 2024-02-01 DIAGNOSIS — K219 Gastro-esophageal reflux disease without esophagitis: Secondary | ICD-10-CM | POA: Diagnosis not present

## 2024-02-01 DIAGNOSIS — R09A2 Foreign body sensation, throat: Secondary | ICD-10-CM | POA: Diagnosis not present

## 2024-02-27 DIAGNOSIS — K208 Other esophagitis without bleeding: Secondary | ICD-10-CM | POA: Diagnosis not present

## 2024-02-27 DIAGNOSIS — K648 Other hemorrhoids: Secondary | ICD-10-CM | POA: Diagnosis not present

## 2024-02-27 DIAGNOSIS — K219 Gastro-esophageal reflux disease without esophagitis: Secondary | ICD-10-CM | POA: Diagnosis not present

## 2024-02-27 DIAGNOSIS — R1314 Dysphagia, pharyngoesophageal phase: Secondary | ICD-10-CM | POA: Diagnosis not present

## 2024-02-27 DIAGNOSIS — Z1211 Encounter for screening for malignant neoplasm of colon: Secondary | ICD-10-CM | POA: Diagnosis not present

## 2024-03-01 DIAGNOSIS — H93A1 Pulsatile tinnitus, right ear: Secondary | ICD-10-CM | POA: Diagnosis not present

## 2024-03-22 DIAGNOSIS — K760 Fatty (change of) liver, not elsewhere classified: Secondary | ICD-10-CM | POA: Diagnosis not present

## 2024-03-22 DIAGNOSIS — R112 Nausea with vomiting, unspecified: Secondary | ICD-10-CM | POA: Diagnosis not present

## 2024-03-22 DIAGNOSIS — N2883 Nephroptosis: Secondary | ICD-10-CM | POA: Diagnosis not present

## 2024-03-25 DIAGNOSIS — K760 Fatty (change of) liver, not elsewhere classified: Secondary | ICD-10-CM | POA: Diagnosis not present

## 2024-03-25 DIAGNOSIS — Z0000 Encounter for general adult medical examination without abnormal findings: Secondary | ICD-10-CM | POA: Diagnosis not present

## 2024-03-25 DIAGNOSIS — H93A1 Pulsatile tinnitus, right ear: Secondary | ICD-10-CM | POA: Diagnosis not present

## 2024-03-25 DIAGNOSIS — R040 Epistaxis: Secondary | ICD-10-CM | POA: Diagnosis not present

## 2024-03-25 DIAGNOSIS — Z5181 Encounter for therapeutic drug level monitoring: Secondary | ICD-10-CM | POA: Diagnosis not present

## 2024-03-25 DIAGNOSIS — E1136 Type 2 diabetes mellitus with diabetic cataract: Secondary | ICD-10-CM | POA: Diagnosis not present

## 2024-03-25 DIAGNOSIS — E7800 Pure hypercholesterolemia, unspecified: Secondary | ICD-10-CM | POA: Diagnosis not present

## 2024-03-26 ENCOUNTER — Other Ambulatory Visit: Payer: Self-pay | Admitting: Internal Medicine

## 2024-03-26 DIAGNOSIS — E2839 Other primary ovarian failure: Secondary | ICD-10-CM

## 2024-04-03 ENCOUNTER — Telehealth: Payer: Self-pay | Admitting: Pharmacist

## 2024-04-03 NOTE — Progress Notes (Addendum)
   04/03/2024  Patient ID: Amanda Valencia, female   DOB: January 31, 1956, 68 y.o.   MRN: [id number]  Patient was identified for DM counseling based on A1c greater than 8% on last lab results.   Tried calling patient today to schedule free DM initial visit me as the pharmacist, with all changes made in collaboration with the PCP. Left HIPAA compliant voicemail requesting call back at earliest convenience to schedule. If patient returns call to office, you can transfer them to my line at [phone]. Thank you!  Attempt #1 (out of 3)  Update from 04/08/24:  - Patient agreed to schedule office visit on 04/24/24 at 9AM to get [facility] set-up and trialed    Delvin File, PharmD [facility]  Phone Number: [phone]

## 2024-04-24 ENCOUNTER — Telehealth: Payer: Self-pay | Admitting: Pharmacist

## 2024-04-24 NOTE — Progress Notes (Signed)
   04/24/2024 Name: Amanda Valencia MRN: [id number] DOB: Sep 10, 1956  Chief Complaint  Patient presents with   Diabetes    SAPPHIRA HARJO is a 68 y.o. year old female who was referred for medication management by their primary care provider, Merl Star, MD. They presented for a face to face visit today.   They were referred to the pharmacist by a quality report for assistance in managing diabetes   [employer] Metric DM patient  Subjective:  Care Team: Primary Care Provider: Merl Star, MD ; Next Scheduled Visit: 09/23/24 Clinical Pharmacist: Delvin File, PharmD  Medication Access/Adherence  Current Pharmacy:  [facility] [address] [place] - [address] [facility] [place] [address] Phone: [phone] Fax: [phone]   Patient reports affordability concerns with their medications: No  Patient reports access/transportation concerns to their pharmacy: No  Patient reports adherence concerns with their medications:  No     Diabetes:  Current medications: Metformin IR 1000mg  twice a day (max dose) Medications tried in the past:   Current glucose readings:  Mornings (7-9AM): 121, 140, 118, 133, 133, 131, 139 Before lunch (1-3PM): 122, 125, 131, 109, 96, 113, 116 Using Accu-Chek Guide meter; testing 2 times daily  Libre 3 Plus CGM applied during visit today  Patient denies hypoglycemic s/sx including dizziness, shakiness, sweating. Patient denies hyperglycemic symptoms including polyuria, polydipsia, polyphagia, nocturia, neuropathy, blurred vision.  Current meal patterns:  - Breakfast: Coffee, 2 boiled eggs - Lunch (biggest meal): Cabbage, baked chicken, candy yam (working on smaller portions) - Dinner (6PM): Salad w/ ham, turkey + cheese - Evening snack: Bowl of fruit  Current physical activity: Exercise classes at rec center and [employer] several days per week  Current medication access support: [employer] Medicare Advantage SNP  A1c  03/25/24: 8.6% eGFR: 87  Objective:  No results found for: "HGBA1C"  No results found for: "CREATININE", "BUN", "NA", "K", "CL", "CO2"  No results found for: "CHOL", "HDL", "LDLCALC", "LDLDIRECT", "TRIG", "CHOLHDL"  Medications Reviewed Today   Medications were not reviewed in this encounter       Assessment/Plan:   Diabetes: - Currently uncontrolled - Reviewed long term cardiovascular and renal outcomes of uncontrolled blood sugar - Reviewed goal A1c, goal fasting, and goal 2 hour post prandial glucose - Recommend to check glucose daily    Follow Up Plan:  - Follow-up call on 05/08/24 at 9AM to check Libreview and see about patient interest in CGM (will be $75 monthly) - Applied Libre 3 Plus CGM sample on today while in-office    Delvin File, PharmD [facility]  Phone Number: [phone]

## 2024-05-08 ENCOUNTER — Telehealth: Payer: Self-pay | Admitting: Pharmacist

## 2024-05-08 NOTE — Progress Notes (Addendum)
 05/08/2024 Name: Amanda Valencia MRN: [id number] DOB: 1956-11-27  Chief Complaint  Patient presents with   Diabetes    Amanda Valencia is a 68 y.o. year old female who was referred for medication management by their primary care provider, Merl Star, MD. They presented for a face to face visit today.   They were referred to the pharmacist by a quality report for assistance in managing diabetes   [employer] [place] Metric DM patient  Subjective:  Care Team: Primary Care Provider: Merl Star, MD ; Next Scheduled Visit: 09/23/24 Clinical Pharmacist: Delvin File, PharmD  Medication Access/Adherence  Current Pharmacy:  [facility] [address] [facility] [place] - [address] [facility] [place] [address] Phone: [phone] Fax: [phone]   Patient reports affordability concerns with their medications: No  Patient reports access/transportation concerns to their pharmacy: No  Patient reports adherence concerns with their medications:  No     Diabetes:  Current medications: Metformin IR 1000mg  twice a day (max dose) Medications tried in the past:   Current glucose readings:  Mornings (7-9AM): 121, 140, 118, 133, 133, 131, 139 Before lunch (1-3PM): 122, 125, 131, 109, 96, 113, 116 Using Accu-Chek Guide meter; testing 2 times daily  Libre 3 Plus CGM applied during 5/21 visit  Date of Download: 05/08/24 % Time CGM is active: 96% Average Glucose: 128 mg/dL Glucose Management Indicator: 6.4  Glucose Variability: 24.5 (goal <36%) Time in Goal:  - Time very high range: 1% - Time high range: 7% - Time in range 70-180: 92% - Time below range: 0% Observed patterns: Had high mealtime spikes first 2-3 days, but used device to determine how to eat properly going forward  Patient denies hypoglycemic s/sx including dizziness, shakiness, sweating. Patient denies hyperglycemic symptoms including polyuria, polydipsia, polyphagia, nocturia, neuropathy, blurred  vision.  Current meal patterns:  - Breakfast: Coffee, 2 boiled eggs - Lunch (biggest meal): Cabbage, baked chicken, candy yam (working on smaller portions) - Dinner (6PM): Salad w/ ham, turkey + cheese - Evening snack: Bowl of fruit  Current physical activity: Exercise classes at rec center and [employer] several days per week  Current medication access support: [employer] Medicare Advantage SNP  A1c 03/25/24: 8.6% eGFR: 87  Objective:  No results found for: "HGBA1C"  No results found for: "CREATININE", "BUN", "NA", "K", "CL", "CO2"  No results found for: "CHOL", "HDL", "LDLCALC", "LDLDIRECT", "TRIG", "CHOLHDL"  Medications Reviewed Today   Medications were not reviewed in this encounter       Assessment/Plan:   Diabetes: - Currently uncontrolled - Reviewed long term cardiovascular and renal outcomes of uncontrolled blood sugar - Reviewed goal A1c, goal fasting, and goal 2 hour post prandial glucose - Recommend to check glucose daily - Controlled with Libre device    Follow Up Plan:  - No follow-up needed - Will keep eye on report in 3 months to ensure patient still doing well and observe for low BG readings <55 that would qualify her for CGM coverage - Estimated A1c of 6.4% with metformin and diet changes alone - Requesting to get [place] 3 plus sent to the pharmacy and set-up for $75 monthly  *Called pharmacy and added discount card- should've been $75 but somehow went through for $39.99 instead- advised to leave it and patient will pay for the medication  Update from 05/10/24:  - Patient has picked up the sensors and technician confirmed it was for 2 sensors   Delvin File, PharmD [facility]  Phone Number: [phone]

## 2024-05-29 ENCOUNTER — Ambulatory Visit (HOSPITAL_BASED_OUTPATIENT_CLINIC_OR_DEPARTMENT_OTHER)
Admission: RE | Admit: 2024-05-29 | Discharge: 2024-05-29 | Disposition: A | Discharge status: Home / Self Care | Source: Ambulatory Visit | Attending: Internal Medicine | Admitting: Internal Medicine

## 2024-05-29 DIAGNOSIS — Z780 Asymptomatic menopausal state: Secondary | ICD-10-CM | POA: Diagnosis not present

## 2024-05-29 DIAGNOSIS — E2839 Other primary ovarian failure: Secondary | ICD-10-CM | POA: Diagnosis present

## 2024-06-03 DIAGNOSIS — K219 Gastro-esophageal reflux disease without esophagitis: Secondary | ICD-10-CM | POA: Diagnosis not present

## 2024-06-05 NOTE — Progress Notes (Signed)
 Received call from the patient this morning about getting Libre filled further. Advised that the script was sent for a 1 year supply. She just has to request refills each month and ensure [facility] continues to process it the same way as last month with the coupon attached.  Confirmed understanding and thanked for assistance.

## 2024-08-09 ENCOUNTER — Telehealth: Payer: Self-pay | Admitting: Pharmacist

## 2024-08-09 NOTE — Progress Notes (Signed)
   08/09/2024  Patient ID: Amanda Valencia, female   DOB: 07-05-1956, 68 y.o.   MRN: [id number]  Received message from office that patient called asking about Herlene price being $39.99 prior and is now $74.99 at the most recent pick-up on 9/2.   Called and spoke with the pharmacy. Confirmed the sensors were billed the same way for the first 3 months, but it was likely a promotional offer with the card where she gets 3 months of sensors at the $39.99 price and it is $75 going forward. Original price I was told by rep was always the $75, so the $39.99 for the first few fills was cheaper than I anticipated.  Called patient and advised it will be $75 going forward. Current estimated A1c is 6.5%. Only concern seen is occasional highs with dinner meals but says she knows how to appropriately correct these now. Confirmed understanding and is aware of the $75 monthly going forward.    Aloysius Lewis, PharmD [facility]  Phone Number: [phone]

## 2024-09-23 DIAGNOSIS — E7800 Pure hypercholesterolemia, unspecified: Secondary | ICD-10-CM | POA: Diagnosis not present

## 2024-09-23 DIAGNOSIS — K760 Fatty (change of) liver, not elsewhere classified: Secondary | ICD-10-CM | POA: Diagnosis not present

## 2024-09-23 DIAGNOSIS — E1136 Type 2 diabetes mellitus with diabetic cataract: Secondary | ICD-10-CM | POA: Diagnosis not present

## 2024-09-25 ENCOUNTER — Other Ambulatory Visit: Payer: Self-pay | Admitting: Internal Medicine

## 2024-09-25 DIAGNOSIS — Z1231 Encounter for screening mammogram for malignant neoplasm of breast: Secondary | ICD-10-CM

## 2024-09-27 ENCOUNTER — Ambulatory Visit
Admission: RE | Admit: 2024-09-27 | Discharge: 2024-09-27 | Disposition: A | Discharge status: Home / Self Care | Source: Ambulatory Visit | Attending: Internal Medicine | Admitting: Internal Medicine

## 2024-09-27 DIAGNOSIS — Z1231 Encounter for screening mammogram for malignant neoplasm of breast: Secondary | ICD-10-CM

## 2024-09-30 ENCOUNTER — Telehealth: Payer: Self-pay | Admitting: Pharmacist

## 2024-09-30 NOTE — Progress Notes (Signed)
   09/30/2024  Patient ID: Amanda Valencia, female   DOB: Apr 21, 1956, 68 y.o.   MRN: [id number]  For tracking purposes, with use of Freestyle Libre for dietary changes, patient's A1c went from 8.6% in April to 7.9% on 09/23/24.   Still declines adding ACE/ARB and any additional diabetes medications to assist with blood sugar lowering. Will continue to monitor.    Aloysius Lewis, PharmD, [facility] Phone Number: [phone]

## 2024-12-18 ENCOUNTER — Other Ambulatory Visit
